# Patient Record
Sex: Male | Born: 1960 | Race: White | Hispanic: No | Smoking: Current every day smoker
Health system: Southern US, Community
[De-identification: ages and names within clinical notes are randomized; demographics above are authoritative.]

## PROBLEM LIST (undated history)

## (undated) HISTORY — PX: HERNIA REPAIR: SHX51

---

## 2004-06-18 ENCOUNTER — Ambulatory Visit: Payer: Self-pay | Admitting: General Surgery

## 2007-10-27 ENCOUNTER — Ambulatory Visit: Payer: Self-pay | Admitting: General Surgery

## 2011-08-27 ENCOUNTER — Ambulatory Visit: Payer: Self-pay | Admitting: General Surgery

## 2014-01-31 ENCOUNTER — Emergency Department: Payer: Self-pay | Admitting: Emergency Medicine

## 2014-01-31 LAB — CBC
HCT: 45.7 % (ref 40.0–52.0)
HGB: 14.7 g/dL (ref 13.0–18.0)
MCH: 31.6 pg (ref 26.0–34.0)
MCHC: 32.1 g/dL (ref 32.0–36.0)
MCV: 98 fL (ref 80–100)
PLATELETS: 270 10*3/uL (ref 150–440)
RBC: 4.65 10*6/uL (ref 4.40–5.90)
RDW: 13.7 % (ref 11.5–14.5)
WBC: 9.7 10*3/uL (ref 3.8–10.6)

## 2014-01-31 LAB — COMPREHENSIVE METABOLIC PANEL
ALBUMIN: 3.9 g/dL (ref 3.4–5.0)
Alkaline Phosphatase: 66 U/L
Anion Gap: 6 — ABNORMAL LOW (ref 7–16)
BUN: 7 mg/dL (ref 7–18)
Bilirubin,Total: 0.5 mg/dL (ref 0.2–1.0)
Calcium, Total: 8.5 mg/dL (ref 8.5–10.1)
Chloride: 104 mmol/L (ref 98–107)
Co2: 28 mmol/L (ref 21–32)
Creatinine: 0.97 mg/dL (ref 0.60–1.30)
GLUCOSE: 97 mg/dL (ref 65–99)
Osmolality: 274 (ref 275–301)
Potassium: 3.8 mmol/L (ref 3.5–5.1)
SGOT(AST): 12 U/L — ABNORMAL LOW (ref 15–37)
SGPT (ALT): 18 U/L
SODIUM: 138 mmol/L (ref 136–145)
TOTAL PROTEIN: 6.7 g/dL (ref 6.4–8.2)

## 2014-01-31 LAB — DRUG SCREEN, URINE
AMPHETAMINES, UR SCREEN: NEGATIVE (ref ?–1000)
Barbiturates, Ur Screen: NEGATIVE (ref ?–200)
Benzodiazepine, Ur Scrn: POSITIVE (ref ?–200)
CANNABINOID 50 NG, UR ~~LOC~~: POSITIVE (ref ?–50)
COCAINE METABOLITE, UR ~~LOC~~: POSITIVE (ref ?–300)
MDMA (Ecstasy)Ur Screen: NEGATIVE (ref ?–500)
METHADONE, UR SCREEN: NEGATIVE (ref ?–300)
OPIATE, UR SCREEN: POSITIVE (ref ?–300)
Phencyclidine (PCP) Ur S: NEGATIVE (ref ?–25)
Tricyclic, Ur Screen: NEGATIVE (ref ?–1000)

## 2014-01-31 LAB — URINALYSIS, COMPLETE
BILIRUBIN, UR: NEGATIVE
Bacteria: NONE SEEN
Blood: NEGATIVE
Glucose,UR: NEGATIVE mg/dL (ref 0–75)
LEUKOCYTE ESTERASE: NEGATIVE
NITRITE: NEGATIVE
PH: 5 (ref 4.5–8.0)
Protein: NEGATIVE
RBC,UR: 14 /HPF (ref 0–5)
SPECIFIC GRAVITY: 1.024 (ref 1.003–1.030)
SQUAMOUS EPITHELIAL: NONE SEEN
WBC UR: 1 /HPF (ref 0–5)

## 2014-01-31 LAB — SEDIMENTATION RATE: Erythrocyte Sed Rate: 7 mm/hr (ref 0–20)

## 2014-01-31 LAB — TROPONIN I

## 2014-01-31 LAB — RAPID INFLUENZA A&B ANTIGENS (ARMC ONLY)

## 2016-02-19 IMAGING — CT CT HEAD WITHOUT CONTRAST
1 series · 16 of 30 positions shown, 20 images · non-contrast
Comparison: None.

CLINICAL DATA: Headache and stiff neck.

EXAM:
CT HEAD WITHOUT CONTRAST
TECHNIQUE: Contiguous axial images were obtained from the base of the skull
through the vertex without intravenous contrast.

[Series 2: head wo · axial · 0.43mm/px · z∈[+16,+171]mm · 16 of 36 slices shown, 20 images]
[im 2/36  brain]
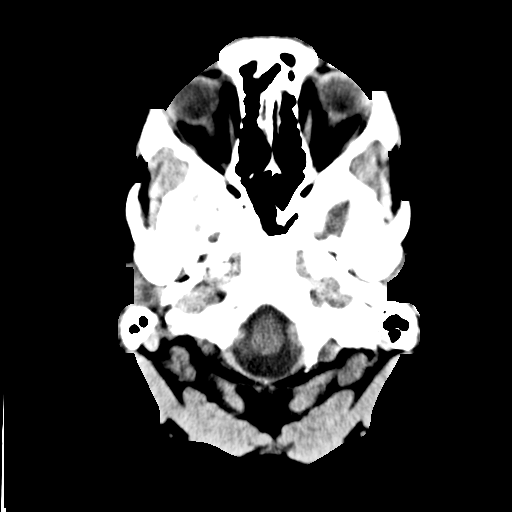
[im 2/36  bone]
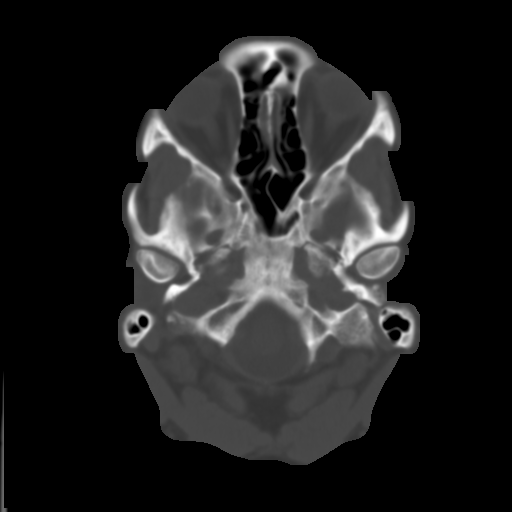
[im 4/36  brain]
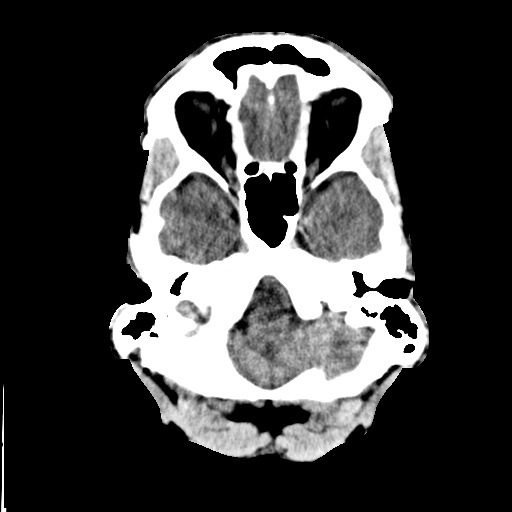
[im 7/36  brain]
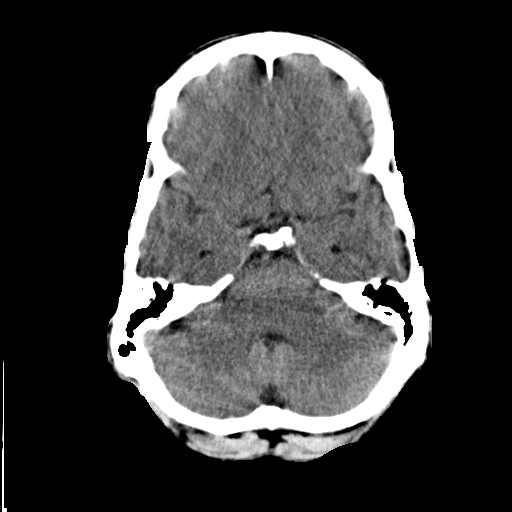
[im 9/36  brain]
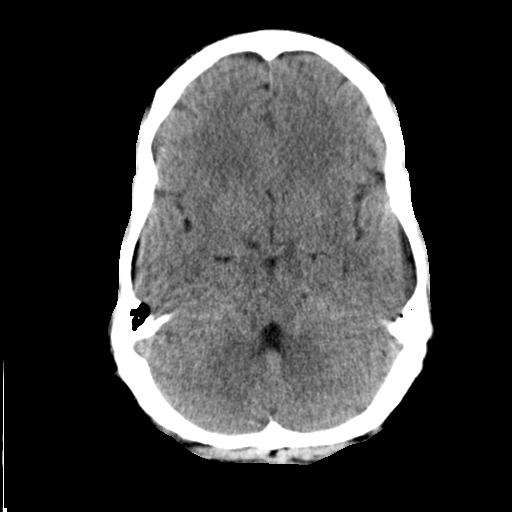
[im 10/36  brain]
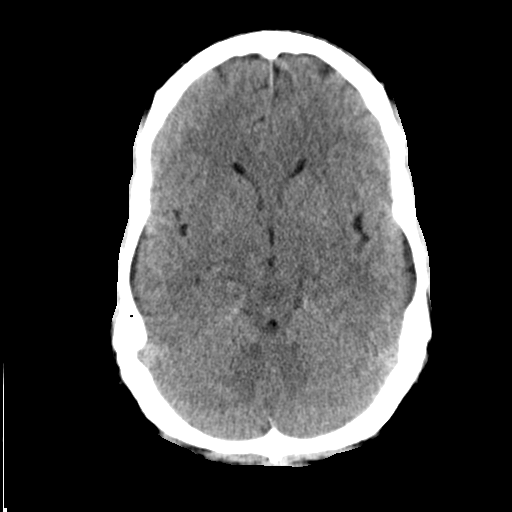
[im 10/36  bone]
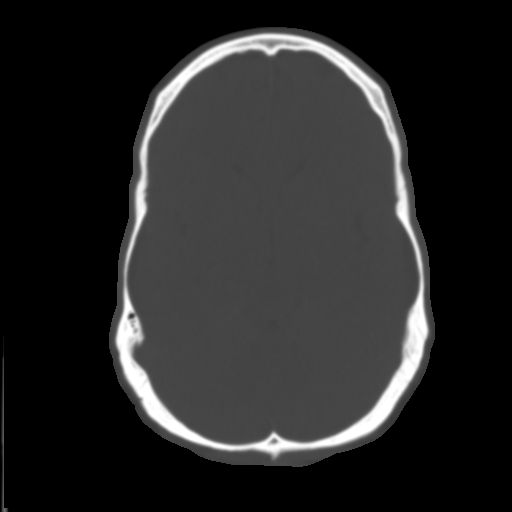
[im 13/36  brain]
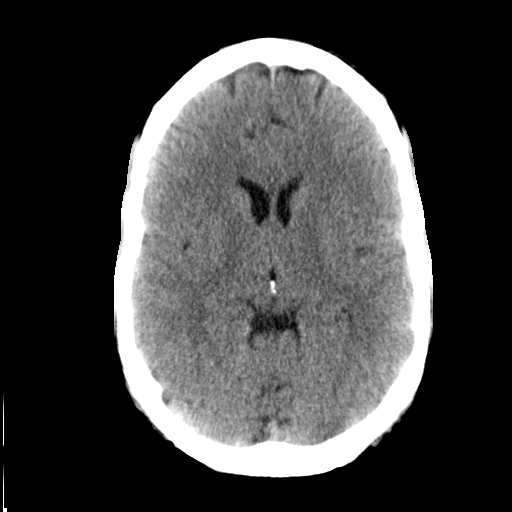
[im 15/36  brain]
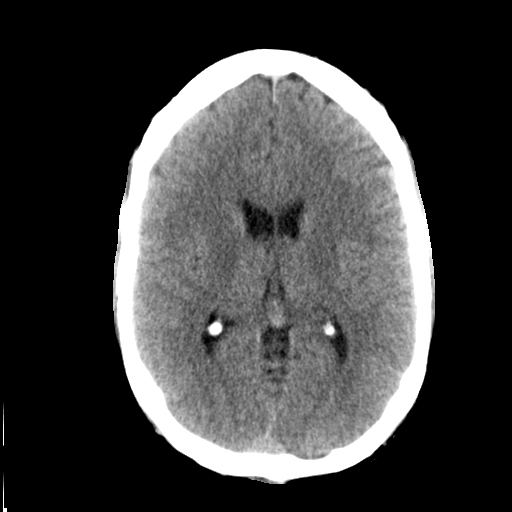
[im 17/36  brain]
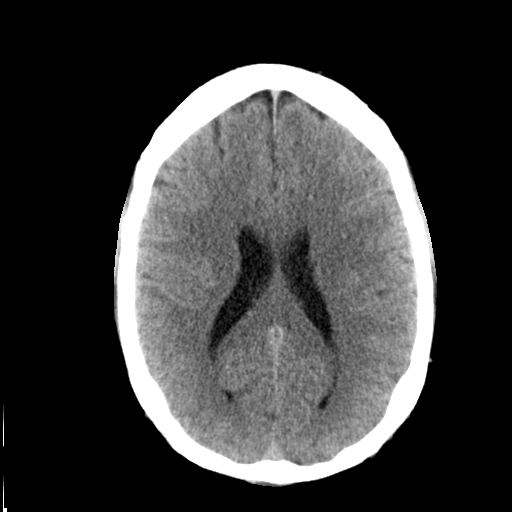
[im 19/36  brain]
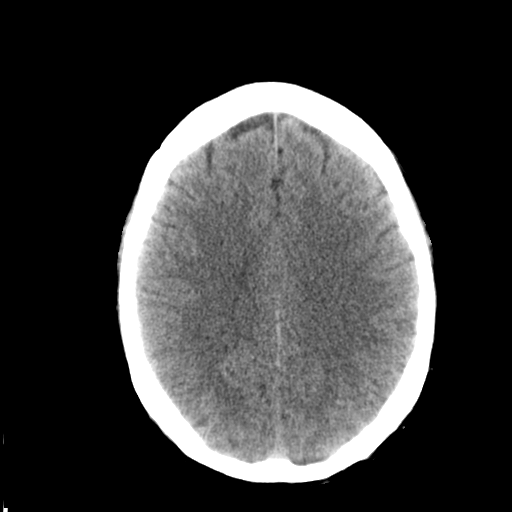
[im 19/36  bone]
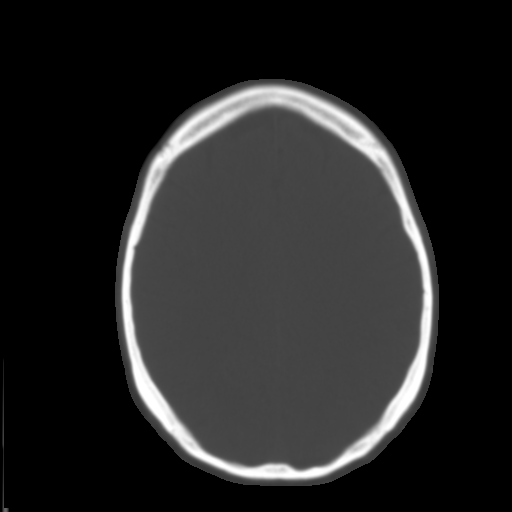
[im 21/36  brain]
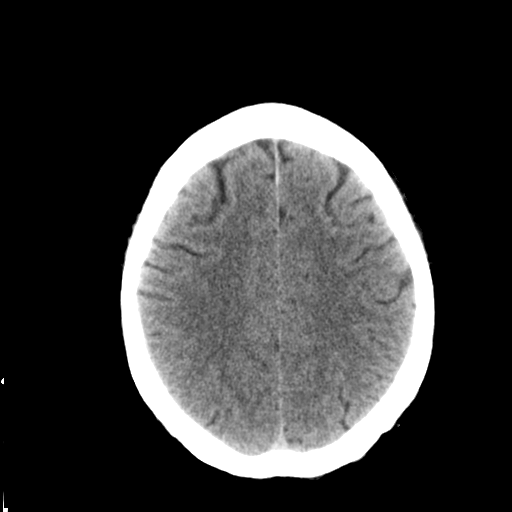
[im 23/36  brain]
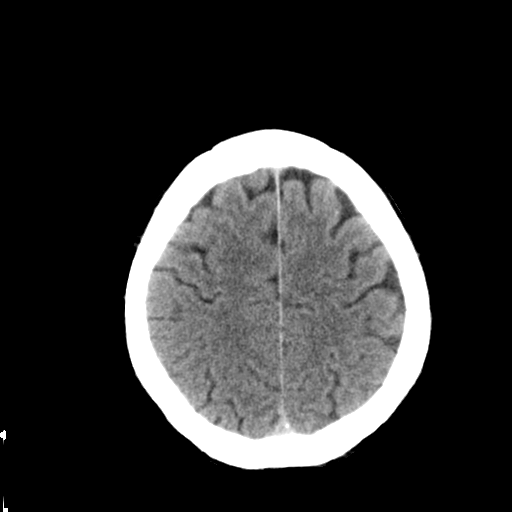
[im 26/36  brain]
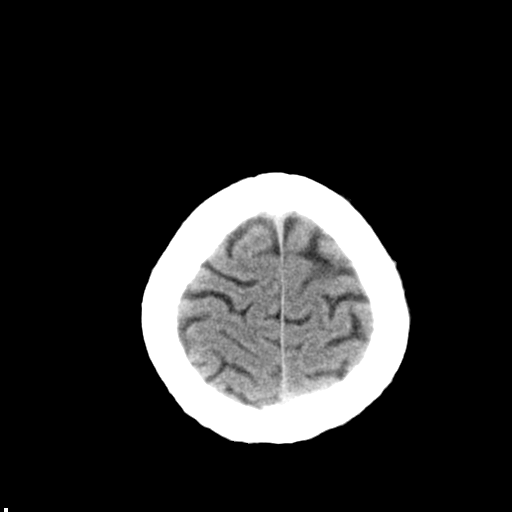
[im 27/36  brain]
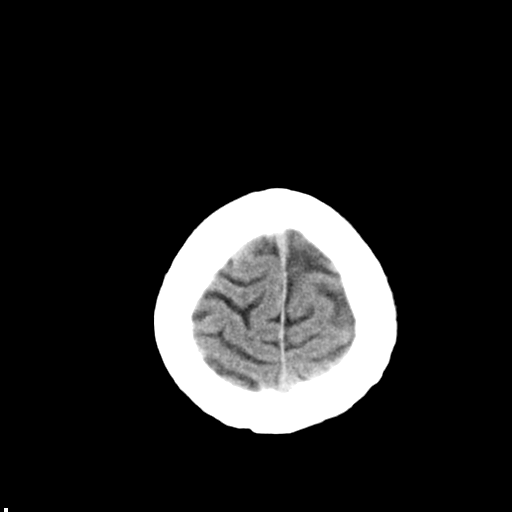
[im 27/36  bone]
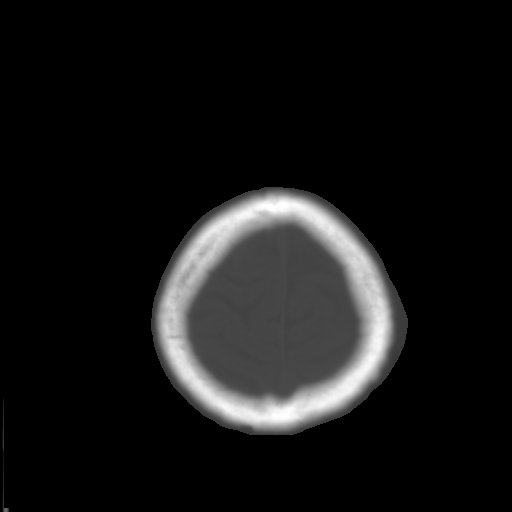
[im 29/36  brain]
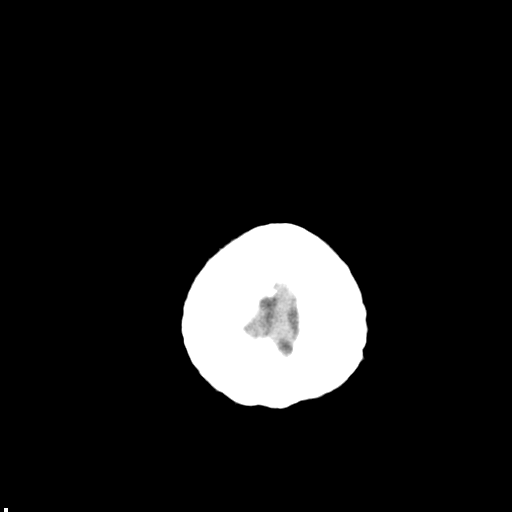
[im 32/36  brain]
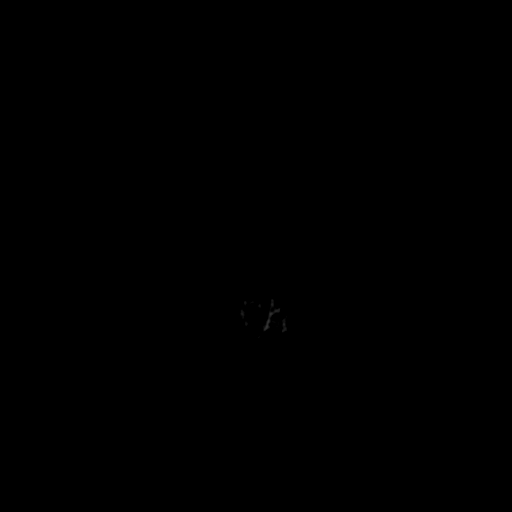
[im 34/36  brain]
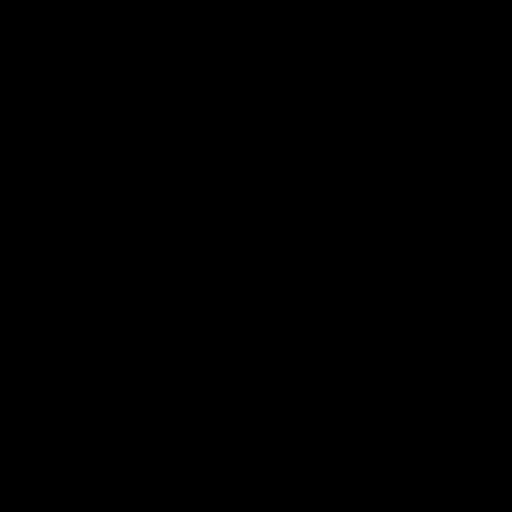

[16 of 30 positions shown; findings below may reference images not displayed]

FINDINGS: Normal appearing cerebral hemispheres and posterior fossa
structures. Normal size and position of the ventricles. No
intracranial hemorrhage, mass lesion or CT evidence of acute
infarction. Unremarkable bones and included paranasal sinuses.
IMPRESSION: Normal examination.

## 2019-03-22 ENCOUNTER — Other Ambulatory Visit: Payer: Self-pay

## 2019-03-22 DIAGNOSIS — Z20822 Contact with and (suspected) exposure to covid-19: Secondary | ICD-10-CM

## 2019-03-24 ENCOUNTER — Telehealth: Payer: Self-pay

## 2019-03-24 NOTE — Telephone Encounter (Signed)
Pt called for test results for covid. Signed pt up for MyChart. Advised pt that MyChart will notify him of results.

## 2019-03-25 LAB — NOVEL CORONAVIRUS, NAA: SARS-CoV-2, NAA: NOT DETECTED

## 2024-03-13 ENCOUNTER — Other Ambulatory Visit: Payer: Self-pay

## 2024-03-13 DIAGNOSIS — E7800 Pure hypercholesterolemia, unspecified: Secondary | ICD-10-CM

## 2024-03-13 DIAGNOSIS — Z7689 Persons encountering health services in other specified circumstances: Secondary | ICD-10-CM

## 2024-03-15 ENCOUNTER — Ambulatory Visit: Admitting: Certified Registered"

## 2024-03-15 ENCOUNTER — Encounter
Admission: RE | Disposition: A | Discharge status: Home / Self Care | Payer: Self-pay | Source: Home / Self Care | Attending: Gastroenterology

## 2024-03-15 ENCOUNTER — Ambulatory Visit
Admission: RE | Admit: 2024-03-15 | Discharge: 2024-03-15 | Disposition: A | Discharge status: Home / Self Care | Payer: Self-pay | Attending: Gastroenterology | Admitting: Gastroenterology

## 2024-03-15 ENCOUNTER — Encounter: Payer: Self-pay | Admitting: Gastroenterology

## 2024-03-15 DIAGNOSIS — D120 Benign neoplasm of cecum: Secondary | ICD-10-CM | POA: Insufficient documentation

## 2024-03-15 DIAGNOSIS — K644 Residual hemorrhoidal skin tags: Secondary | ICD-10-CM | POA: Insufficient documentation

## 2024-03-15 DIAGNOSIS — J449 Chronic obstructive pulmonary disease, unspecified: Secondary | ICD-10-CM | POA: Insufficient documentation

## 2024-03-15 DIAGNOSIS — K621 Rectal polyp: Secondary | ICD-10-CM | POA: Diagnosis not present

## 2024-03-15 DIAGNOSIS — F17210 Nicotine dependence, cigarettes, uncomplicated: Secondary | ICD-10-CM | POA: Diagnosis not present

## 2024-03-15 DIAGNOSIS — Z1211 Encounter for screening for malignant neoplasm of colon: Secondary | ICD-10-CM | POA: Diagnosis present

## 2024-03-15 HISTORY — PX: POLYPECTOMY: SHX149

## 2024-03-15 HISTORY — PX: COLONOSCOPY: SHX5424

## 2024-03-15 SURGERY — COLONOSCOPY
Anesthesia: General

## 2024-03-15 MED ORDER — SODIUM CHLORIDE 0.9 % IV SOLN: INTRAVENOUS | Status: DC

## 2024-03-15 MED ORDER — LIDOCAINE HCL (CARDIAC) PF 100 MG/5ML IV SOSY
PREFILLED_SYRINGE | INTRAVENOUS | Status: DC | PRN
  Administered 2024-03-15: 60 mg via INTRAVENOUS

## 2024-03-15 MED ORDER — PROPOFOL 500 MG/50ML IV EMUL
INTRAVENOUS | Status: DC | PRN
  Administered 2024-03-15: 130 ug/kg/min via INTRAVENOUS

## 2024-03-15 MED ORDER — PROPOFOL 10 MG/ML IV BOLUS
INTRAVENOUS | Status: DC | PRN
  Administered 2024-03-15: 70 mg via INTRAVENOUS

## 2024-03-15 NOTE — Anesthesia Preprocedure Evaluation (Signed)
 Anesthesia Evaluation  Patient identified by MRN, date of birth, ID band Patient awake    Reviewed: Allergy & Precautions, NPO status , Patient's Chart, lab work & pertinent test results  History of Anesthesia Complications Negative for: history of anesthetic complications  Airway Mallampati: III  TM Distance: <3 FB Neck ROM: full    Dental  (+) Chipped, Poor Dentition   Pulmonary neg shortness of breath, COPD, Current Smoker and Patient abstained from smoking.   Pulmonary exam normal        Cardiovascular Exercise Tolerance: Good negative cardio ROS Normal cardiovascular exam     Neuro/Psych negative neurological ROS  negative psych ROS   GI/Hepatic negative GI ROS, Neg liver ROS,neg GERD  ,,  Endo/Other  negative endocrine ROS    Renal/GU negative Renal ROS  negative genitourinary   Musculoskeletal   Abdominal   Peds  Hematology negative hematology ROS (+)   Anesthesia Other Findings .  Past Surgical History: No date: HERNIA REPAIR  BMI    Body Mass Index: 25.10 kg/m      Reproductive/Obstetrics negative OB ROS                              Anesthesia Physical Anesthesia Plan  ASA: 2  Anesthesia Plan: General   Post-op Pain Management:    Induction: Intravenous  PONV Risk Score and Plan: Propofol infusion and TIVA  Airway Management Planned: Natural Airway and Nasal Cannula  Additional Equipment:   Intra-op Plan:   Post-operative Plan:   Informed Consent: I have reviewed the patients History and Physical, chart, labs and discussed the procedure including the risks, benefits and alternatives for the proposed anesthesia with the patient or authorized representative who has indicated his/her understanding and acceptance.     Dental Advisory Given  Plan Discussed with: Anesthesiologist, CRNA and Surgeon  Anesthesia Plan Comments: (Patient consented for risks of  anesthesia including but not limited to:  - adverse reactions to medications - risk of airway placement if required - damage to eyes, teeth, lips or other oral mucosa - nerve damage due to positioning  - sore throat or hoarseness - Damage to heart, brain, nerves, lungs, other parts of body or loss of life  Patient voiced understanding and assent.)        Anesthesia Quick Evaluation

## 2024-03-15 NOTE — H&P (Signed)
 Dean JONELLE Brooklyn, MD [facility], DHIP [address]  [place], [place] [address]  Main: [phone] Fax:  [phone] Pager: [phone]   Primary Care Physician:  [facility] Primary Gastroenterologist:  Dr. Corinn JONELLE Petty  Pre-Procedure History & Physical: HPI:  Dean Petty. is a 63 y.o. male is here for an colonoscopy.   History reviewed. No pertinent past medical history.  Past Surgical History:  Procedure Laterality Date   HERNIA REPAIR      Prior to Admission medications   Medication Sig Start Date End Date Taking? Authorizing Provider  amLODipine (NORVASC) 5 MG tablet Take 5 mg by mouth daily.   Yes [provider]  rosuvastatin (CRESTOR) 20 MG tablet Take 20 mg by mouth daily.   Yes [provider]    Allergies as of 03/07/2024   (Not on File)    History reviewed. No pertinent family history.  Social History   Socioeconomic History   Marital status: Divorced    Spouse name: Not on file   Number of children: Not on file   Years of education: Not on file   Highest education level: Not on file  Occupational History   Not on file  Tobacco Use   Smoking status: Every Day    Current packs/day: 0.50    Types: Cigarettes   Smokeless tobacco: Never  Substance and Sexual Activity   Alcohol use: Yes    Comment: couple times a month   Drug use: Never   Sexual activity: Not Currently  Other Topics Concern   Not on file  Social History Narrative   Not on file   Social Drivers of Health   Financial Resource Strain: Low Risk  (11/28/2023)   Received from [facility]   Overall Financial Resource Strain (CARDIA)    Difficulty of Paying Living Expenses: Not hard at all  Food Insecurity: No Food Insecurity (11/28/2023)   Received from [facility]   Hunger Vital Sign    Within the past 12 months, you worried that your food would run out before you got the  money to buy more.: Never true    Within the past 12 months, the food you bought just didn't last and you didn't have money to get more.: Never true  Transportation Needs: No Transportation Needs (11/28/2023)   Received from [facility] - Transportation    In the past 12 months, has lack of transportation kept you from medical appointments or from getting medications?: No    Lack of Transportation (Non-Medical): No  Physical Activity: Not on file  Stress: Not on file  Social Connections: Not on file  Intimate Partner Violence: Not on file    Review of Systems: See HPI, otherwise negative ROS  Physical Exam: BP (!) 142/83   Pulse 73   Temp (!) 96.9 F (36.1 C) (Temporal)   Resp 18   Ht 5' 9 (1.753 m)   Wt 77.1 kg   SpO2 98%   BMI 25.10 kg/m  General:   Alert,  pleasant and cooperative in NAD Head:  Normocephalic and atraumatic. Neck:  Supple; no masses or thyromegaly. Lungs:  Clear throughout to auscultation.    Heart:  Regular rate and rhythm. Abdomen:  Soft, nontender and nondistended. Normal bowel sounds, without guarding, and without rebound.   Neurologic:  Alert and  oriented x4;  grossly normal neurologically.  Impression/Plan: Dean Petty. is  here for an colonoscopy to be performed for colon cancer screening  Risks, benefits, limitations, and alternatives regarding  colonoscopy have been reviewed with the patient.  Questions have been answered.  All parties agreeable.   Dean Brooklyn, MD  03/15/2024, 8:41 AM

## 2024-03-15 NOTE — Transfer of Care (Signed)
 Immediate Anesthesia Transfer of Care Note  Patient: Dean Petty.  Procedure(s) Performed: COLONOSCOPY POLYPECTOMY, INTESTINE  Patient Location: PACU  Anesthesia Type:General  Level of Consciousness: drowsy  Airway & Oxygen Therapy: Patient Spontanous Breathing  Post-op Assessment: Report given to RN  Post vital signs: stable  Last Vitals:  Vitals Value Taken Time  BP    Temp    Pulse    Resp    SpO2      Last Pain:  Vitals:   03/15/24 0835  TempSrc: Temporal  PainSc: 0-No pain         Complications: No notable events documented.

## 2024-03-15 NOTE — Op Note (Signed)
 [facility] Patient Name: Dean Petty Procedure Date: 03/15/2024 8:51 AM MRN: [id number] Account #: [id number] Date of Birth: 24-Jul-1960 Admit Type: Outpatient Age: 63 Room: [facility] ENDO ROOM 3 Gender: Male Note Status: Finalized Instrument Name: Peds Colonoscope [id number] Procedure:             Colonoscopy Indications:           Screening for colorectal malignant neoplasm, Last                         colonoscopy: April 2013, Last colonoscopy 10 years ago Providers:             Corinn Jess Brooklyn MD, MD Referring MD:          Rexford Bibber Md, MD (Referring MD) Medicines:             General Anesthesia Complications:         No immediate complications. Estimated blood loss: None. Procedure:             Pre-Anesthesia Assessment:                        - Prior to the procedure, a History and Physical was                         performed, and patient medications and allergies were                         reviewed. The patient is competent. The risks and                         benefits of the procedure and the sedation options and                         risks were discussed with the patient. All questions                         were answered and informed consent was obtained.                         Patient identification and proposed procedure were                         verified by the physician, the nurse, the                         anesthesiologist, the anesthetist and the technician                         in the pre-procedure area in the procedure room in the                         endoscopy suite. Mental Status Examination: alert and                         oriented. Airway Examination: normal oropharyngeal                         airway and neck mobility. Respiratory Examination:  clear to auscultation. CV Examination: normal.                         Prophylactic Antibiotics: The patient does not require                          prophylactic antibiotics. Prior Anticoagulants: The                         patient has taken no anticoagulant or antiplatelet                         agents. ASA Grade Assessment: II - A patient with mild                         systemic disease. After reviewing the risks and                         benefits, the patient was deemed in satisfactory                         condition to undergo the procedure. The anesthesia                         plan was to use general anesthesia. Immediately prior                         to administration of medications, the patient was                         re-assessed for adequacy to receive sedatives. The                         heart rate, respiratory rate, oxygen saturations,                         blood pressure, adequacy of pulmonary ventilation, and                         response to care were monitored throughout the                         procedure. The physical status of the patient was                         re-assessed after the procedure.                        After obtaining informed consent, the colonoscope was                         passed under direct vision. Throughout the procedure,                         the patient's blood pressure, pulse, and oxygen                         saturations were monitored continuously. The  Colonoscope was introduced through the anus and                         advanced to the the terminal ileum, with                         identification of the appendiceal orifice and IC                         valve. The colonoscopy was performed without                         difficulty. The patient tolerated the procedure well.                         The quality of the bowel preparation was evaluated                         using the BBPS [facility] Bowel Preparation Scale) with                         scores of: Right Colon = 3, Transverse Colon = 3 and                          Left Colon = 3 (entire mucosa seen well with no                         residual staining, small fragments of stool or opaque                         liquid). The total BBPS score equals 9. The terminal                         ileum, ileocecal valve, appendiceal orifice, and                         rectum were photographed. Findings:      The perianal and digital rectal examinations were normal. Pertinent       negatives include normal sphincter tone and no palpable rectal lesions.      Skin tags were found on perianal exam.      The terminal ileum appeared normal.      Two sessile polyps were found in the rectum and cecum. The polyps were 3       to 5 mm in size. These polyps were removed with a cold snare. Resection       and retrieval were complete. Estimated blood loss: none.      Non-bleeding external hemorrhoids were found during retroflexion. The       hemorrhoids were medium-sized. Impression:            - Perianal skin tags found on perianal exam.                        - The examined portion of the ileum was normal.                        - Two 3 to 5 mm polyps in the rectum and  in the cecum,                         removed with a cold snare. Resected and retrieved.                        - Non-bleeding external hemorrhoids. Recommendation:        - Discharge patient to home (with escort).                        - Resume previous diet today.                        - Continue present medications.                        - Await pathology results.                        - Repeat colonoscopy in 7-10 years for surveillance                         based on pathology results. Procedure Code(s):     --- Professional ---                        [phone], Colonoscopy, flexible; with removal of                         tumor(s), polyp(s), or other lesion(s) by snare                         technique Diagnosis Code(s):     --- Professional ---                        K64.4, Residual  hemorrhoidal skin tags                        Z12.11, Encounter for screening for malignant neoplasm                         of colon                        D12.8, Benign neoplasm of rectum                        D12.0, Benign neoplasm of cecum CPT copyright 2022 [employer]. All rights reserved. The codes documented in this report are preliminary and upon coder review may  be revised to meet current compliance requirements. Dr. [name] Corinn Jess Brooklyn MD, MD 03/15/2024 9:09:26 AM This report has been signed electronically. Number of Addenda: 0 Note Initiated On: 03/15/2024 8:51 AM Scope Withdrawal Time: 0 hours 8 minutes 41 seconds  Total Procedure Duration: 0 hours 12 minutes 11 seconds  Estimated Blood Loss:  Estimated blood loss: none.      [facility]

## 2024-03-15 NOTE — Anesthesia Postprocedure Evaluation (Signed)
 Anesthesia Post Note  Patient: Dean Petty.  Procedure(s) Performed: COLONOSCOPY POLYPECTOMY, INTESTINE  Patient location during evaluation: Endoscopy Anesthesia Type: General Level of consciousness: awake and alert Pain management: pain level controlled Vital Signs Assessment: post-procedure vital signs reviewed and stable Respiratory status: spontaneous breathing, nonlabored ventilation and respiratory function stable Cardiovascular status: blood pressure returned to baseline and stable Postop Assessment: no apparent nausea or vomiting Anesthetic complications: no   No notable events documented.   Last Vitals:  Vitals:   03/15/24 0930 03/15/24 0932  BP: (!) 141/106 (!) 149/87  Pulse: 82 78  Resp: 16 (!) 27  Temp:    SpO2: 98% 99%    Last Pain:  Vitals:   03/15/24 0930  TempSrc:   PainSc: 0-No pain                 Fairy POUR Cassidey Barrales

## 2024-03-16 ENCOUNTER — Ambulatory Visit: Payer: Self-pay | Admitting: Gastroenterology

## 2024-03-16 LAB — SURGICAL PATHOLOGY

## 2024-04-06 ENCOUNTER — Ambulatory Visit
Admission: RE | Admit: 2024-04-06 | Discharge: 2024-04-06 | Disposition: A | Discharge status: Home / Self Care | Payer: Self-pay | Source: Ambulatory Visit

## 2024-04-06 DIAGNOSIS — E7800 Pure hypercholesterolemia, unspecified: Secondary | ICD-10-CM | POA: Insufficient documentation

## 2024-04-06 DIAGNOSIS — Z7689 Persons encountering health services in other specified circumstances: Secondary | ICD-10-CM | POA: Insufficient documentation
